# Patient Record
Sex: Female | Born: 2000 | Race: White | Hispanic: No | Marital: Single | Smoking: Never smoker
Health system: Southern US, Community
[De-identification: ages and names within clinical notes are randomized; demographics above are authoritative.]

## PROBLEM LIST (undated history)

## (undated) DIAGNOSIS — J45909 Unspecified asthma, uncomplicated: Secondary | ICD-10-CM

## (undated) DIAGNOSIS — R634 Abnormal weight loss: Secondary | ICD-10-CM

## (undated) DIAGNOSIS — F4323 Adjustment disorder with mixed anxiety and depressed mood: Secondary | ICD-10-CM

## (undated) DIAGNOSIS — R480 Dyslexia and alexia: Secondary | ICD-10-CM

## (undated) DIAGNOSIS — K219 Gastro-esophageal reflux disease without esophagitis: Secondary | ICD-10-CM

## (undated) DIAGNOSIS — F909 Attention-deficit hyperactivity disorder, unspecified type: Secondary | ICD-10-CM

## (undated) DIAGNOSIS — R7989 Other specified abnormal findings of blood chemistry: Secondary | ICD-10-CM

## (undated) HISTORY — DX: Adjustment disorder with mixed anxiety and depressed mood: F43.23

## (undated) HISTORY — DX: Unspecified asthma, uncomplicated: J45.909

## (undated) HISTORY — DX: Dyslexia and alexia: R48.0

## (undated) HISTORY — DX: Gastro-esophageal reflux disease without esophagitis: K21.9

## (undated) HISTORY — DX: Abnormal weight loss: R63.4

## (undated) HISTORY — DX: Other specified abnormal findings of blood chemistry: R79.89

## (undated) HISTORY — DX: Attention-deficit hyperactivity disorder, unspecified type: F90.9

---
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
## 2021-09-26 ENCOUNTER — Encounter: Payer: Self-pay | Admitting: *Deleted
